# Patient Record
Sex: Male | Born: 1955 | Race: White | Hispanic: No | Marital: Married | Smoking: Current every day smoker
Health system: Southern US, Community
[De-identification: ages and names within clinical notes are randomized; demographics above are authoritative.]

## PROBLEM LIST (undated history)

## (undated) ENCOUNTER — Emergency Department (HOSPITAL_COMMUNITY): Discharge status: Against Medical Advice | Payer: Self-pay

## (undated) DIAGNOSIS — E119 Type 2 diabetes mellitus without complications: Secondary | ICD-10-CM

## (undated) DIAGNOSIS — I10 Essential (primary) hypertension: Secondary | ICD-10-CM

## (undated) DIAGNOSIS — M1990 Unspecified osteoarthritis, unspecified site: Secondary | ICD-10-CM

## (undated) HISTORY — DX: Essential (primary) hypertension: I10

## (undated) HISTORY — PX: CERVICAL SPINE SURGERY: SHX589

## (undated) HISTORY — DX: Unspecified osteoarthritis, unspecified site: M19.90

## (undated) HISTORY — DX: Type 2 diabetes mellitus without complications: E11.9

## (undated) HISTORY — PX: BACK SURGERY: SHX140

---

## 1998-10-31 ENCOUNTER — Encounter: Payer: Self-pay | Admitting: Emergency Medicine

## 1998-10-31 ENCOUNTER — Emergency Department (HOSPITAL_COMMUNITY): Admission: EM | Admit: 1998-10-31 | Discharge: 1998-10-31 | Payer: Self-pay | Admitting: Emergency Medicine

## 1998-11-05 ENCOUNTER — Ambulatory Visit (HOSPITAL_COMMUNITY): Admission: RE | Admit: 1998-11-05 | Discharge: 1998-11-05 | Payer: Self-pay | Admitting: Neurological Surgery

## 1998-11-05 ENCOUNTER — Encounter: Payer: Self-pay | Admitting: Neurological Surgery

## 1999-01-24 ENCOUNTER — Inpatient Hospital Stay (HOSPITAL_COMMUNITY): Admission: RE | Admit: 1999-01-24 | Discharge: 1999-01-24 | Payer: Self-pay | Admitting: Neurosurgery

## 1999-01-24 ENCOUNTER — Encounter: Payer: Self-pay | Admitting: Neurosurgery

## 1999-02-07 ENCOUNTER — Inpatient Hospital Stay (HOSPITAL_COMMUNITY): Admission: EM | Admit: 1999-02-07 | Discharge: 1999-02-08 | Payer: Self-pay | Admitting: Emergency Medicine

## 1999-02-07 ENCOUNTER — Encounter: Payer: Self-pay | Admitting: Emergency Medicine

## 1999-02-08 ENCOUNTER — Encounter: Payer: Self-pay | Admitting: Internal Medicine

## 1999-02-28 ENCOUNTER — Encounter: Admission: RE | Admit: 1999-02-28 | Discharge: 1999-02-28 | Payer: Self-pay | Admitting: Internal Medicine

## 1999-09-17 ENCOUNTER — Emergency Department (HOSPITAL_COMMUNITY): Admission: EM | Admit: 1999-09-17 | Discharge: 1999-09-17 | Payer: Self-pay | Admitting: Emergency Medicine

## 2004-03-16 ENCOUNTER — Ambulatory Visit (HOSPITAL_COMMUNITY): Admission: RE | Admit: 2004-03-16 | Discharge: 2004-03-16 | Payer: Self-pay | Admitting: Neurosurgery

## 2005-04-24 ENCOUNTER — Ambulatory Visit (HOSPITAL_COMMUNITY): Admission: RE | Admit: 2005-04-24 | Discharge: 2005-04-25 | Payer: Self-pay | Admitting: Otolaryngology

## 2006-09-06 IMAGING — CR DG CHEST 2V
2 series · 2 of 2 positions shown · non-contrast
Comparison: none

CLINICAL DATA: sleep apnea; hypertension; cough; smoked 2 ppd for 30 years; no chest discomfort or chest pain
 [id number]-S VIEWS:
 PA and lateral views of the chest are made and show mild diffuse peribronchial thickening.  There is no active infiltrate, consolidation or mass.  The heart and mediastinum are normal.  Bony thorax shows anterior degenerative hypertrophic spurs.

[view not recorded (1 of 2)]
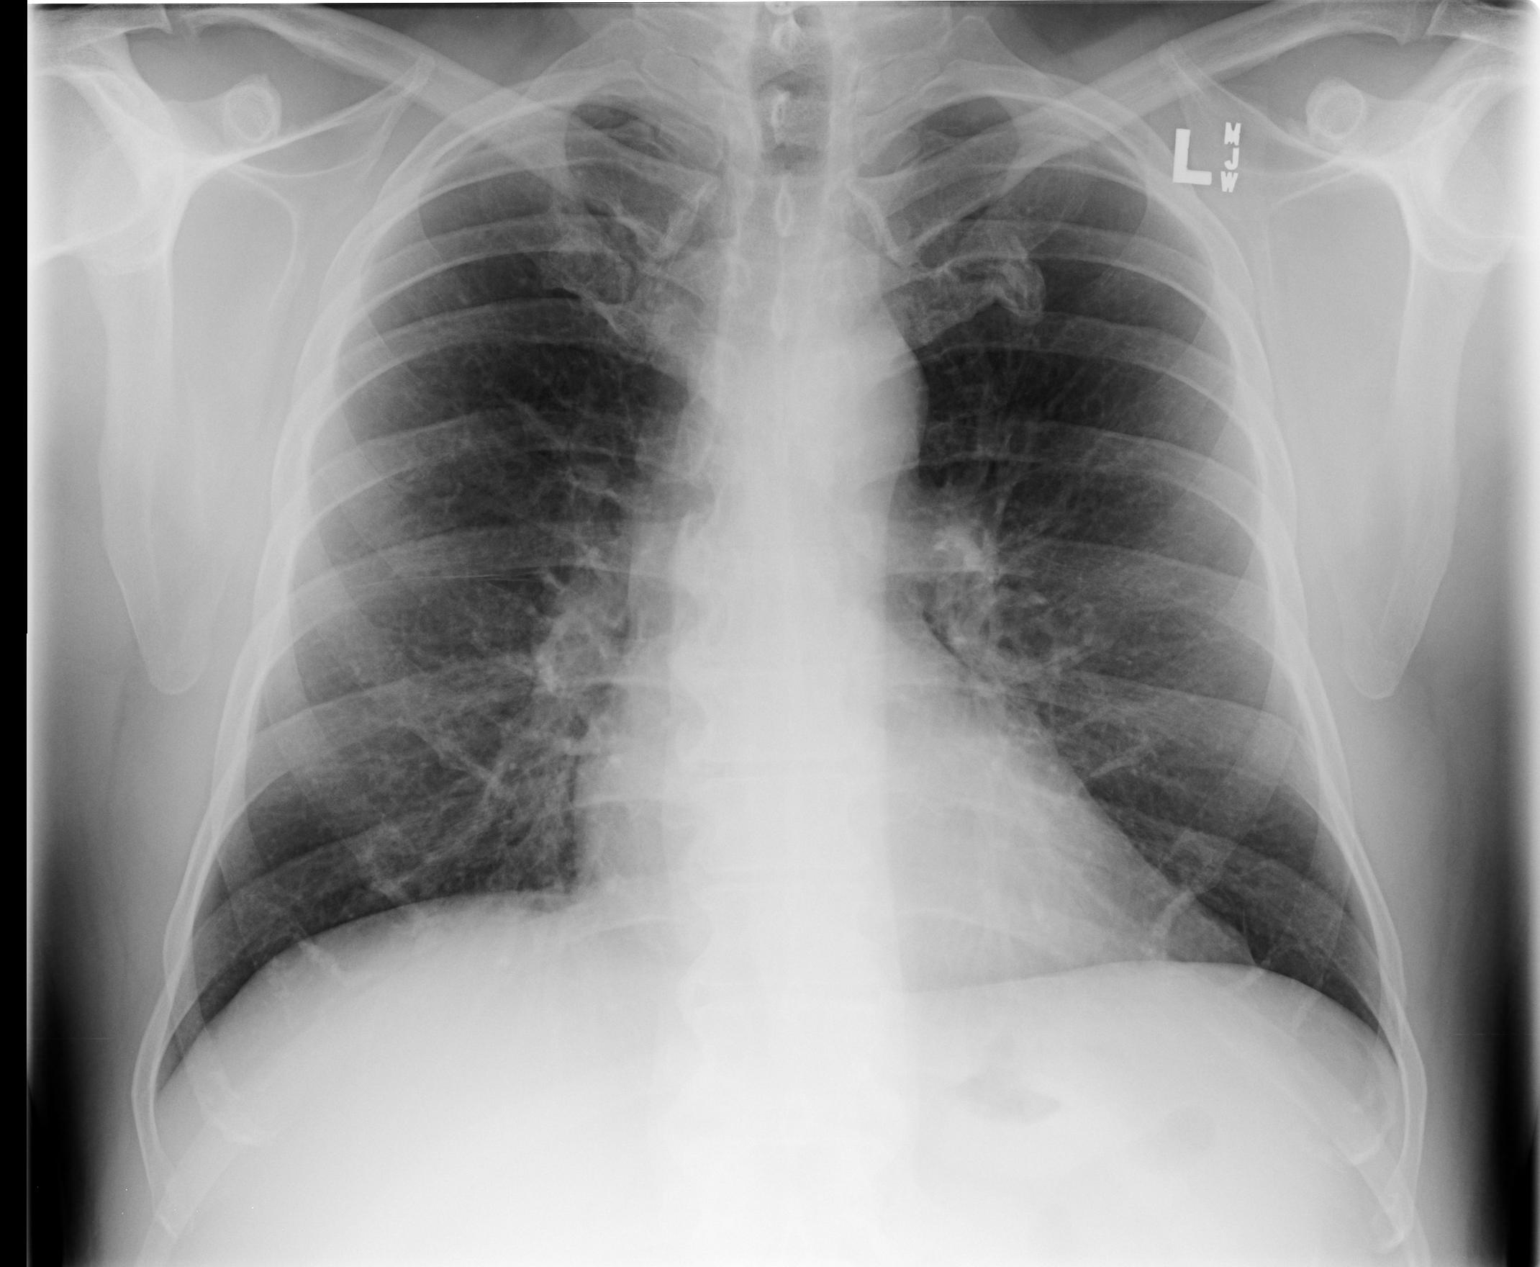

[view not recorded (2 of 2)]
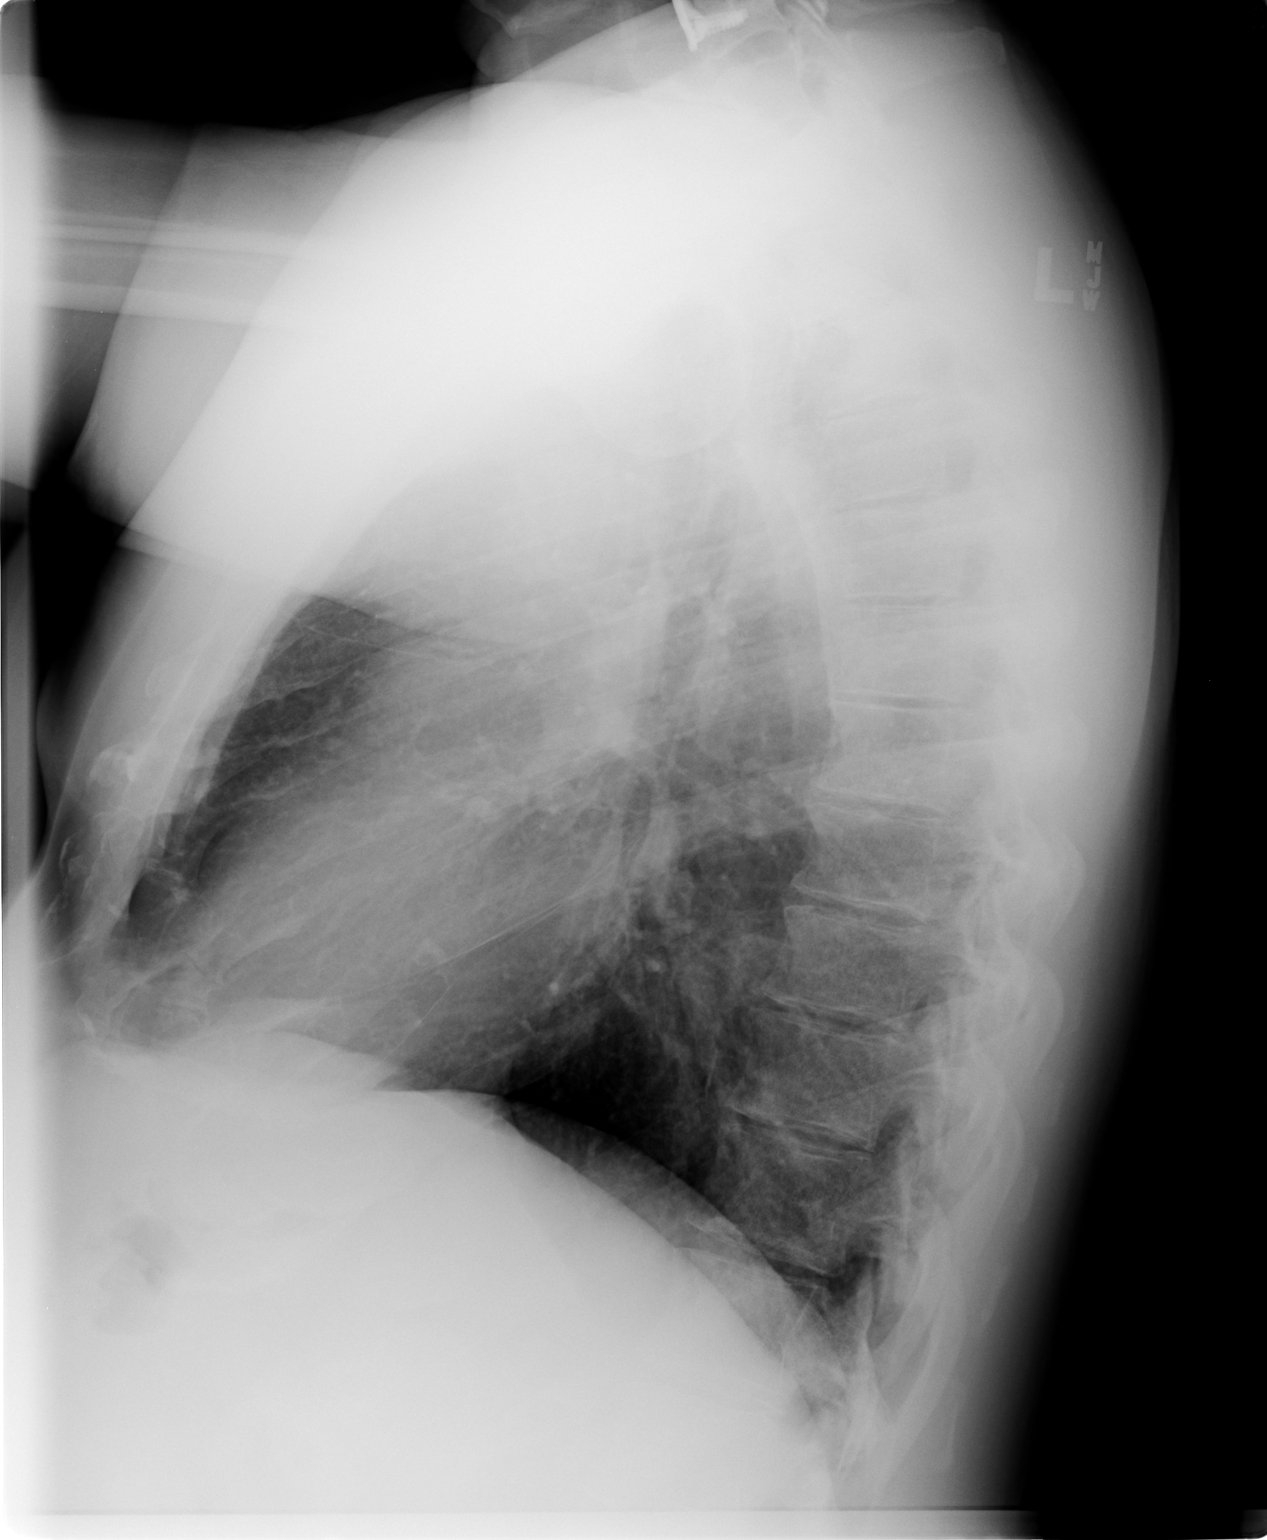

[2 of 2 positions shown; findings below may reference images not displayed]

IMPRESSION: Mild diffuse peribronchial thickening.  No evidence of active cardiopulmonary disease.

## 2010-07-15 ENCOUNTER — Encounter: Payer: Self-pay | Admitting: Neurosurgery

## 2011-10-01 ENCOUNTER — Ambulatory Visit: Payer: Self-pay | Admitting: Physical Therapy

## 2013-01-29 ENCOUNTER — Ambulatory Visit: Payer: Self-pay | Admitting: Physical Therapy

## 2015-11-29 DIAGNOSIS — G894 Chronic pain syndrome: Secondary | ICD-10-CM | POA: Diagnosis not present

## 2015-11-29 DIAGNOSIS — M1288 Other specific arthropathies, not elsewhere classified, other specified site: Secondary | ICD-10-CM | POA: Diagnosis not present

## 2015-12-17 DIAGNOSIS — R160 Hepatomegaly, not elsewhere classified: Secondary | ICD-10-CM | POA: Diagnosis not present

## 2015-12-17 DIAGNOSIS — R109 Unspecified abdominal pain: Secondary | ICD-10-CM | POA: Diagnosis not present

## 2015-12-17 DIAGNOSIS — K5790 Diverticulosis of intestine, part unspecified, without perforation or abscess without bleeding: Secondary | ICD-10-CM | POA: Diagnosis not present

## 2015-12-17 DIAGNOSIS — R140 Abdominal distension (gaseous): Secondary | ICD-10-CM | POA: Diagnosis not present

## 2015-12-17 DIAGNOSIS — K760 Fatty (change of) liver, not elsewhere classified: Secondary | ICD-10-CM | POA: Diagnosis not present

## 2015-12-17 DIAGNOSIS — M160 Bilateral primary osteoarthritis of hip: Secondary | ICD-10-CM | POA: Diagnosis not present

## 2015-12-17 DIAGNOSIS — K5730 Diverticulosis of large intestine without perforation or abscess without bleeding: Secondary | ICD-10-CM | POA: Diagnosis not present

## 2015-12-17 DIAGNOSIS — I4510 Unspecified right bundle-branch block: Secondary | ICD-10-CM | POA: Diagnosis not present

## 2015-12-17 DIAGNOSIS — R1084 Generalized abdominal pain: Secondary | ICD-10-CM | POA: Diagnosis not present

## 2015-12-19 DIAGNOSIS — R17 Unspecified jaundice: Secondary | ICD-10-CM | POA: Diagnosis not present

## 2015-12-19 DIAGNOSIS — R160 Hepatomegaly, not elsewhere classified: Secondary | ICD-10-CM | POA: Diagnosis not present

## 2015-12-19 DIAGNOSIS — R1084 Generalized abdominal pain: Secondary | ICD-10-CM | POA: Diagnosis not present

## 2015-12-19 DIAGNOSIS — R300 Dysuria: Secondary | ICD-10-CM | POA: Diagnosis not present

## 2015-12-19 DIAGNOSIS — D72829 Elevated white blood cell count, unspecified: Secondary | ICD-10-CM | POA: Diagnosis not present

## 2015-12-19 DIAGNOSIS — R140 Abdominal distension (gaseous): Secondary | ICD-10-CM | POA: Diagnosis not present

## 2015-12-19 DIAGNOSIS — I4510 Unspecified right bundle-branch block: Secondary | ICD-10-CM | POA: Diagnosis not present

## 2015-12-19 DIAGNOSIS — R194 Change in bowel habit: Secondary | ICD-10-CM | POA: Diagnosis not present

## 2015-12-19 DIAGNOSIS — Q6100 Congenital renal cyst, unspecified: Secondary | ICD-10-CM | POA: Diagnosis not present

## 2015-12-19 DIAGNOSIS — K5790 Diverticulosis of intestine, part unspecified, without perforation or abscess without bleeding: Secondary | ICD-10-CM | POA: Diagnosis not present

## 2015-12-19 DIAGNOSIS — R7309 Other abnormal glucose: Secondary | ICD-10-CM | POA: Diagnosis not present

## 2015-12-21 DIAGNOSIS — R748 Abnormal levels of other serum enzymes: Secondary | ICD-10-CM | POA: Diagnosis not present

## 2015-12-21 DIAGNOSIS — R160 Hepatomegaly, not elsewhere classified: Secondary | ICD-10-CM | POA: Diagnosis not present

## 2015-12-21 DIAGNOSIS — D509 Iron deficiency anemia, unspecified: Secondary | ICD-10-CM | POA: Diagnosis not present

## 2015-12-21 DIAGNOSIS — N281 Cyst of kidney, acquired: Secondary | ICD-10-CM | POA: Diagnosis not present

## 2015-12-27 DIAGNOSIS — I10 Essential (primary) hypertension: Secondary | ICD-10-CM | POA: Diagnosis not present

## 2015-12-29 DIAGNOSIS — Z6836 Body mass index (BMI) 36.0-36.9, adult: Secondary | ICD-10-CM | POA: Diagnosis not present

## 2015-12-29 DIAGNOSIS — M1288 Other specific arthropathies, not elsewhere classified, other specified site: Secondary | ICD-10-CM | POA: Diagnosis not present

## 2015-12-29 DIAGNOSIS — G894 Chronic pain syndrome: Secondary | ICD-10-CM | POA: Diagnosis not present

## 2016-01-01 DIAGNOSIS — R160 Hepatomegaly, not elsewhere classified: Secondary | ICD-10-CM | POA: Diagnosis not present

## 2016-01-12 DIAGNOSIS — I10 Essential (primary) hypertension: Secondary | ICD-10-CM | POA: Diagnosis not present

## 2016-01-12 DIAGNOSIS — G8929 Other chronic pain: Secondary | ICD-10-CM | POA: Diagnosis not present

## 2016-01-12 DIAGNOSIS — Q6100 Congenital renal cyst, unspecified: Secondary | ICD-10-CM | POA: Diagnosis not present

## 2016-01-12 DIAGNOSIS — E669 Obesity, unspecified: Secondary | ICD-10-CM | POA: Diagnosis not present

## 2016-01-12 DIAGNOSIS — R7989 Other specified abnormal findings of blood chemistry: Secondary | ICD-10-CM | POA: Diagnosis not present

## 2016-01-12 DIAGNOSIS — R7309 Other abnormal glucose: Secondary | ICD-10-CM | POA: Diagnosis not present

## 2016-01-15 DIAGNOSIS — Q6100 Congenital renal cyst, unspecified: Secondary | ICD-10-CM | POA: Diagnosis not present

## 2016-01-15 DIAGNOSIS — I10 Essential (primary) hypertension: Secondary | ICD-10-CM | POA: Diagnosis not present

## 2016-01-15 DIAGNOSIS — R7309 Other abnormal glucose: Secondary | ICD-10-CM | POA: Diagnosis not present

## 2016-01-17 DIAGNOSIS — K760 Fatty (change of) liver, not elsewhere classified: Secondary | ICD-10-CM | POA: Diagnosis not present

## 2016-01-17 DIAGNOSIS — N281 Cyst of kidney, acquired: Secondary | ICD-10-CM | POA: Diagnosis not present

## 2016-01-17 DIAGNOSIS — Q619 Cystic kidney disease, unspecified: Secondary | ICD-10-CM | POA: Diagnosis not present

## 2016-04-10 DIAGNOSIS — E119 Type 2 diabetes mellitus without complications: Secondary | ICD-10-CM | POA: Diagnosis not present

## 2016-04-10 DIAGNOSIS — F419 Anxiety disorder, unspecified: Secondary | ICD-10-CM | POA: Diagnosis not present

## 2016-04-10 DIAGNOSIS — G479 Sleep disorder, unspecified: Secondary | ICD-10-CM | POA: Diagnosis not present

## 2016-04-10 DIAGNOSIS — Z125 Encounter for screening for malignant neoplasm of prostate: Secondary | ICD-10-CM | POA: Diagnosis not present

## 2016-04-10 DIAGNOSIS — I10 Essential (primary) hypertension: Secondary | ICD-10-CM | POA: Diagnosis not present

## 2016-04-10 DIAGNOSIS — G4733 Obstructive sleep apnea (adult) (pediatric): Secondary | ICD-10-CM | POA: Diagnosis not present

## 2016-05-01 DIAGNOSIS — M1288 Other specific arthropathies, not elsewhere classified, other specified site: Secondary | ICD-10-CM | POA: Diagnosis not present

## 2016-05-01 DIAGNOSIS — G894 Chronic pain syndrome: Secondary | ICD-10-CM | POA: Diagnosis not present

## 2016-05-01 DIAGNOSIS — Z6835 Body mass index (BMI) 35.0-35.9, adult: Secondary | ICD-10-CM | POA: Diagnosis not present

## 2016-07-17 DIAGNOSIS — F419 Anxiety disorder, unspecified: Secondary | ICD-10-CM | POA: Diagnosis not present

## 2016-07-17 DIAGNOSIS — I10 Essential (primary) hypertension: Secondary | ICD-10-CM | POA: Diagnosis not present

## 2016-07-17 DIAGNOSIS — G4733 Obstructive sleep apnea (adult) (pediatric): Secondary | ICD-10-CM | POA: Diagnosis not present

## 2016-07-17 DIAGNOSIS — R7309 Other abnormal glucose: Secondary | ICD-10-CM | POA: Diagnosis not present

## 2017-07-28 DIAGNOSIS — E119 Type 2 diabetes mellitus without complications: Secondary | ICD-10-CM | POA: Diagnosis not present

## 2017-07-28 DIAGNOSIS — R69 Illness, unspecified: Secondary | ICD-10-CM | POA: Diagnosis not present

## 2017-07-28 DIAGNOSIS — G4733 Obstructive sleep apnea (adult) (pediatric): Secondary | ICD-10-CM | POA: Diagnosis not present

## 2017-07-28 DIAGNOSIS — E781 Pure hyperglyceridemia: Secondary | ICD-10-CM | POA: Diagnosis not present

## 2017-07-28 DIAGNOSIS — I10 Essential (primary) hypertension: Secondary | ICD-10-CM | POA: Diagnosis not present

## 2017-07-28 DIAGNOSIS — R7309 Other abnormal glucose: Secondary | ICD-10-CM | POA: Diagnosis not present

## 2017-07-28 DIAGNOSIS — N529 Male erectile dysfunction, unspecified: Secondary | ICD-10-CM | POA: Diagnosis not present

## 2017-08-26 DIAGNOSIS — M47816 Spondylosis without myelopathy or radiculopathy, lumbar region: Secondary | ICD-10-CM | POA: Diagnosis not present

## 2017-08-26 DIAGNOSIS — M160 Bilateral primary osteoarthritis of hip: Secondary | ICD-10-CM | POA: Diagnosis not present

## 2017-08-26 DIAGNOSIS — G894 Chronic pain syndrome: Secondary | ICD-10-CM | POA: Diagnosis not present

## 2017-09-22 DIAGNOSIS — I10 Essential (primary) hypertension: Secondary | ICD-10-CM | POA: Diagnosis not present

## 2017-09-22 DIAGNOSIS — N529 Male erectile dysfunction, unspecified: Secondary | ICD-10-CM | POA: Diagnosis not present

## 2017-09-23 DIAGNOSIS — M160 Bilateral primary osteoarthritis of hip: Secondary | ICD-10-CM | POA: Diagnosis not present

## 2017-12-11 DIAGNOSIS — I10 Essential (primary) hypertension: Secondary | ICD-10-CM | POA: Diagnosis not present

## 2017-12-11 DIAGNOSIS — R1111 Vomiting without nausea: Secondary | ICD-10-CM | POA: Diagnosis not present

## 2017-12-11 DIAGNOSIS — E119 Type 2 diabetes mellitus without complications: Secondary | ICD-10-CM | POA: Diagnosis not present

## 2017-12-11 DIAGNOSIS — R140 Abdominal distension (gaseous): Secondary | ICD-10-CM | POA: Diagnosis not present

## 2017-12-11 DIAGNOSIS — R109 Unspecified abdominal pain: Secondary | ICD-10-CM | POA: Diagnosis not present

## 2017-12-11 DIAGNOSIS — R1110 Vomiting, unspecified: Secondary | ICD-10-CM | POA: Diagnosis not present

## 2017-12-11 DIAGNOSIS — R69 Illness, unspecified: Secondary | ICD-10-CM | POA: Diagnosis not present

## 2017-12-11 DIAGNOSIS — R143 Flatulence: Secondary | ICD-10-CM | POA: Diagnosis not present

## 2017-12-11 DIAGNOSIS — Z7984 Long term (current) use of oral hypoglycemic drugs: Secondary | ICD-10-CM | POA: Diagnosis not present

## 2017-12-12 DIAGNOSIS — R1110 Vomiting, unspecified: Secondary | ICD-10-CM | POA: Diagnosis not present

## 2017-12-12 DIAGNOSIS — R1111 Vomiting without nausea: Secondary | ICD-10-CM | POA: Diagnosis not present

## 2017-12-12 DIAGNOSIS — R69 Illness, unspecified: Secondary | ICD-10-CM | POA: Diagnosis not present

## 2017-12-12 DIAGNOSIS — R143 Flatulence: Secondary | ICD-10-CM | POA: Diagnosis not present

## 2017-12-12 DIAGNOSIS — E119 Type 2 diabetes mellitus without complications: Secondary | ICD-10-CM | POA: Diagnosis not present

## 2017-12-12 DIAGNOSIS — R109 Unspecified abdominal pain: Secondary | ICD-10-CM | POA: Diagnosis not present

## 2017-12-12 DIAGNOSIS — R140 Abdominal distension (gaseous): Secondary | ICD-10-CM | POA: Diagnosis not present

## 2017-12-12 DIAGNOSIS — I10 Essential (primary) hypertension: Secondary | ICD-10-CM | POA: Diagnosis not present

## 2017-12-12 DIAGNOSIS — Z7984 Long term (current) use of oral hypoglycemic drugs: Secondary | ICD-10-CM | POA: Diagnosis not present

## 2018-02-12 DIAGNOSIS — R7309 Other abnormal glucose: Secondary | ICD-10-CM | POA: Diagnosis not present

## 2018-02-12 DIAGNOSIS — E781 Pure hyperglyceridemia: Secondary | ICD-10-CM | POA: Diagnosis not present

## 2018-02-12 DIAGNOSIS — E119 Type 2 diabetes mellitus without complications: Secondary | ICD-10-CM | POA: Diagnosis not present

## 2018-02-12 DIAGNOSIS — R8290 Unspecified abnormal findings in urine: Secondary | ICD-10-CM | POA: Diagnosis not present

## 2018-02-12 DIAGNOSIS — G4733 Obstructive sleep apnea (adult) (pediatric): Secondary | ICD-10-CM | POA: Diagnosis not present

## 2018-02-12 DIAGNOSIS — I10 Essential (primary) hypertension: Secondary | ICD-10-CM | POA: Diagnosis not present

## 2018-02-12 DIAGNOSIS — R69 Illness, unspecified: Secondary | ICD-10-CM | POA: Diagnosis not present

## 2018-02-12 DIAGNOSIS — N529 Male erectile dysfunction, unspecified: Secondary | ICD-10-CM | POA: Diagnosis not present

## 2018-03-09 DIAGNOSIS — M160 Bilateral primary osteoarthritis of hip: Secondary | ICD-10-CM | POA: Diagnosis not present

## 2018-03-09 DIAGNOSIS — M47816 Spondylosis without myelopathy or radiculopathy, lumbar region: Secondary | ICD-10-CM | POA: Diagnosis not present

## 2018-03-18 DIAGNOSIS — M47816 Spondylosis without myelopathy or radiculopathy, lumbar region: Secondary | ICD-10-CM | POA: Diagnosis not present

## 2018-04-20 DIAGNOSIS — Z23 Encounter for immunization: Secondary | ICD-10-CM | POA: Diagnosis not present

## 2018-05-04 DIAGNOSIS — E119 Type 2 diabetes mellitus without complications: Secondary | ICD-10-CM | POA: Diagnosis not present

## 2018-05-04 DIAGNOSIS — I10 Essential (primary) hypertension: Secondary | ICD-10-CM | POA: Diagnosis not present

## 2018-09-01 DIAGNOSIS — R7309 Other abnormal glucose: Secondary | ICD-10-CM | POA: Diagnosis not present

## 2018-09-01 DIAGNOSIS — E119 Type 2 diabetes mellitus without complications: Secondary | ICD-10-CM | POA: Diagnosis not present

## 2018-09-01 DIAGNOSIS — R69 Illness, unspecified: Secondary | ICD-10-CM | POA: Diagnosis not present

## 2018-09-01 DIAGNOSIS — I10 Essential (primary) hypertension: Secondary | ICD-10-CM | POA: Diagnosis not present

## 2018-09-01 DIAGNOSIS — M25559 Pain in unspecified hip: Secondary | ICD-10-CM | POA: Diagnosis not present

## 2018-09-01 DIAGNOSIS — R8290 Unspecified abnormal findings in urine: Secondary | ICD-10-CM | POA: Diagnosis not present

## 2018-09-01 DIAGNOSIS — M549 Dorsalgia, unspecified: Secondary | ICD-10-CM | POA: Diagnosis not present

## 2018-09-01 DIAGNOSIS — E781 Pure hyperglyceridemia: Secondary | ICD-10-CM | POA: Diagnosis not present

## 2018-09-01 DIAGNOSIS — N529 Male erectile dysfunction, unspecified: Secondary | ICD-10-CM | POA: Diagnosis not present

## 2018-09-01 DIAGNOSIS — G4733 Obstructive sleep apnea (adult) (pediatric): Secondary | ICD-10-CM | POA: Diagnosis not present

## 2018-10-14 ENCOUNTER — Other Ambulatory Visit: Payer: Self-pay

## 2018-10-14 ENCOUNTER — Ambulatory Visit (INDEPENDENT_AMBULATORY_CARE_PROVIDER_SITE_OTHER): Payer: Medicare HMO

## 2018-10-14 ENCOUNTER — Encounter: Payer: Self-pay | Admitting: Orthopaedic Surgery

## 2018-10-14 ENCOUNTER — Ambulatory Visit: Payer: Medicare HMO | Admitting: Orthopaedic Surgery

## 2018-10-14 VITALS — BP 174/93 | HR 80 | Temp 98.1°F | Ht 69.0 in | Wt 255.0 lb

## 2018-10-14 DIAGNOSIS — M25552 Pain in left hip: Secondary | ICD-10-CM | POA: Diagnosis not present

## 2018-10-14 DIAGNOSIS — M25551 Pain in right hip: Secondary | ICD-10-CM

## 2018-10-14 DIAGNOSIS — E119 Type 2 diabetes mellitus without complications: Secondary | ICD-10-CM | POA: Insufficient documentation

## 2018-10-14 MED ORDER — NAPROXEN 500 MG PO TABS: 500.0000 mg | ORAL_TABLET | Freq: Two times a day (BID) | ORAL | 5 refills | Status: AC

## 2018-10-14 NOTE — Progress Notes (Signed)
Subjective:    Patient ID: Daniel Riley, male    DOB: January 26, 1956, 63 y.o.   MRN: [id number]  HPI He has a long history of bilateral hip pain for several years.  He also has lower back pain.  I am to see him for his hips today.  He says he cannot walk very far as his hips hurt.  He has difficulty getting up.  He cannot shop as he cannot walk very well.  He has no trauma, no redness, no swelling of the hips.  About two years ago in [facility] a doctor injected both hips under fluoroscopy he says. He has had injections into his back in the past.  He is referred from [facility] in [place].  I have reviewed his notes.  He says nothing seems to help the pain.  He has tried various medicines, heat and ice.   Review of Systems  Constitutional: Positive for activity change.  Musculoskeletal: Positive for arthralgias and gait problem.  All other systems reviewed and are negative.  For Review of Systems, all other systems reviewed and are negative.  The following is a summary of the past history medically, past history surgically, known current medicines, social history and family history.  This information is gathered electronically by the computer from prior information and documentation.  I review this each visit and have found including this information at this point in the chart is beneficial and informative.   Past Medical History:  Diagnosis Date  . Arthritis   . DM (diabetes mellitus), type 2 (HCC)   . Hypertension     Past Surgical History:  Procedure Laterality Date  . BACK SURGERY     Nerve ablation   . CERVICAL SPINE SURGERY      Current Outpatient Medications on File Prior to Visit  Medication Sig Dispense Refill  . fenofibrate (TRICOR) 145 MG tablet Take by mouth.    . omega-3 acid ethyl esters (LOVAZA) 1 g capsule Take by mouth.    Marland Kitchen lisinopril-hydrochlorothiazide (ZESTORETIC) 20-25 MG tablet Take 1 tablet by mouth daily.    . metFORMIN (GLUCOPHAGE) 500 MG tablet  Take 500 mg by mouth 2 (two) times daily with a meal.     No current facility-administered medications on file prior to visit.     Social History   Socioeconomic History  . Marital status: Legally Separated    Spouse name: Not on file  . Number of children: Not on file  . Years of education: Not on file  . Highest education level: Not on file  Occupational History  . Not on file  Social Needs  . Financial resource strain: Not on file  . Food insecurity:    Worry: Not on file    Inability: Not on file  . Transportation needs:    Medical: Not on file    Non-medical: Not on file  Tobacco Use  . Smoking status: Current Every Day Smoker  . Smokeless tobacco: Never Used  Substance and Sexual Activity  . Alcohol use: Not on file  . Drug use: Not on file  . Sexual activity: Not on file  Lifestyle  . Physical activity:    Days per week: Not on file    Minutes per session: Not on file  . Stress: Not on file  Relationships  . Social connections:    Talks on phone: Not on file    Gets together: Not on file    Attends religious service: Not on  file    Active member of club or organization: Not on file    Attends meetings of clubs or organizations: Not on file    Relationship status: Not on file  . Intimate partner violence:    Fear of current or ex partner: Not on file    Emotionally abused: Not on file    Physically abused: Not on file    Forced sexual activity: Not on file  Other Topics Concern  . Not on file  Social History Narrative  . Not on file    Family History  Problem Relation Age of Onset  . Heart disease Father   . Hypertension Father   . Diabetes Sister   . Hypertension Sister     BP (!) 174/93   Pulse 80   Temp 98.1 F (36.7 C)   Ht 5\' 9"  (1.753 m)   Wt 255 lb (115.7 kg)   BMI 37.66 kg/m   Body mass index is 37.66 kg/m.      Objective:   Physical Exam Vitals signs reviewed.  Constitutional:      Appearance: He is well-developed.  HENT:      Head: Normocephalic and atraumatic.  Eyes:     Conjunctiva/sclera: Conjunctivae normal.     Pupils: Pupils are equal, round, and reactive to light.  Neck:     Musculoskeletal: Normal range of motion and neck supple.  Cardiovascular:     Rate and Rhythm: Normal rate and regular rhythm.  Pulmonary:     Effort: Pulmonary effort is normal.  Abdominal:     Palpations: Abdomen is soft.  Musculoskeletal:     Right hip: He exhibits decreased range of motion.     Left hip: He exhibits decreased range of motion.       Legs:  Skin:    General: Skin is warm and dry.  Neurological:     Mental Status: He is alert and oriented to person, place, and time.     Cranial Nerves: No cranial nerve deficit.     Motor: No abnormal muscle tone.     Coordination: Coordination normal.     Deep Tendon Reflexes: Reflexes are normal and symmetric. Reflexes normal.  Psychiatric:        Behavior: Behavior normal.        Thought Content: Thought content normal.        Judgment: Judgment normal.     X-rays were done of both hips, reported separately.      Assessment & Plan:   Encounter Diagnosis  Name Primary?  . Hip pain, bilateral Yes   I have explained the findings to him.  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  I will begin naprosyn 500po bid pc  I will do virtual visit in one month.  He may need MRI of hip in future.  Call if any problem.  Precautions discussed.   Electronically Signed Darreld McleanWayne Linsi Humann, MD 4/22/202010:02 AM

## 2018-11-11 ENCOUNTER — Other Ambulatory Visit: Payer: Self-pay

## 2018-11-11 ENCOUNTER — Ambulatory Visit: Payer: Medicare HMO | Admitting: Orthopaedic Surgery

## 2020-09-25 DIAGNOSIS — G4733 Obstructive sleep apnea (adult) (pediatric): Secondary | ICD-10-CM | POA: Diagnosis not present

## 2020-09-25 DIAGNOSIS — Z125 Encounter for screening for malignant neoplasm of prostate: Secondary | ICD-10-CM | POA: Diagnosis not present

## 2020-09-25 DIAGNOSIS — E669 Obesity, unspecified: Secondary | ICD-10-CM | POA: Diagnosis not present

## 2020-09-25 DIAGNOSIS — I4510 Unspecified right bundle-branch block: Secondary | ICD-10-CM | POA: Diagnosis not present

## 2020-09-25 DIAGNOSIS — R69 Illness, unspecified: Secondary | ICD-10-CM | POA: Diagnosis not present

## 2020-09-25 DIAGNOSIS — I10 Essential (primary) hypertension: Secondary | ICD-10-CM | POA: Diagnosis not present

## 2020-09-25 DIAGNOSIS — Z0000 Encounter for general adult medical examination without abnormal findings: Secondary | ICD-10-CM | POA: Diagnosis not present

## 2020-11-15 DIAGNOSIS — R69 Illness, unspecified: Secondary | ICD-10-CM | POA: Diagnosis not present

## 2020-11-15 DIAGNOSIS — I10 Essential (primary) hypertension: Secondary | ICD-10-CM | POA: Diagnosis not present

## 2021-01-17 DIAGNOSIS — I10 Essential (primary) hypertension: Secondary | ICD-10-CM | POA: Diagnosis not present

## 2021-02-07 DIAGNOSIS — I10 Essential (primary) hypertension: Secondary | ICD-10-CM | POA: Diagnosis not present

## 2021-03-21 DIAGNOSIS — I10 Essential (primary) hypertension: Secondary | ICD-10-CM | POA: Diagnosis not present

## 2021-04-20 DIAGNOSIS — K921 Melena: Secondary | ICD-10-CM | POA: Diagnosis not present

## 2021-04-20 DIAGNOSIS — I10 Essential (primary) hypertension: Secondary | ICD-10-CM | POA: Diagnosis not present

## 2021-04-20 DIAGNOSIS — Z23 Encounter for immunization: Secondary | ICD-10-CM | POA: Diagnosis not present
